# Patient Record
Sex: Male | Born: 1985 | Race: White | Hispanic: No | Marital: Married
Health system: Southern US, Community
[De-identification: ages and names within clinical notes are randomized; demographics above are authoritative.]

## PROBLEM LIST (undated history)

## (undated) DIAGNOSIS — J9621 Acute and chronic respiratory failure with hypoxia: Secondary | ICD-10-CM

## (undated) DIAGNOSIS — Z930 Tracheostomy status: Secondary | ICD-10-CM

## (undated) DIAGNOSIS — S062X9S Diffuse traumatic brain injury with loss of consciousness of unspecified duration, sequela: Secondary | ICD-10-CM

## (undated) DIAGNOSIS — J189 Pneumonia, unspecified organism: Secondary | ICD-10-CM

---

## 2019-10-14 ENCOUNTER — Inpatient Hospital Stay
Admission: RE | Admit: 2019-10-14 | Discharge: 2019-10-31 | Disposition: A | Discharge status: Rehabilitation Facility | Payer: BC Managed Care – PPO | Source: Other Acute Inpatient Hospital | Attending: Internal Medicine | Admitting: Internal Medicine

## 2019-10-14 ENCOUNTER — Other Ambulatory Visit (HOSPITAL_COMMUNITY): Payer: Self-pay

## 2019-10-14 DIAGNOSIS — Z931 Gastrostomy status: Secondary | ICD-10-CM

## 2019-10-14 DIAGNOSIS — J9690 Respiratory failure, unspecified, unspecified whether with hypoxia or hypercapnia: Secondary | ICD-10-CM

## 2019-10-14 DIAGNOSIS — J189 Pneumonia, unspecified organism: Secondary | ICD-10-CM | POA: Diagnosis present

## 2019-10-14 DIAGNOSIS — Z4659 Encounter for fitting and adjustment of other gastrointestinal appliance and device: Secondary | ICD-10-CM

## 2019-10-14 DIAGNOSIS — S062X9S Diffuse traumatic brain injury with loss of consciousness of unspecified duration, sequela: Secondary | ICD-10-CM

## 2019-10-14 DIAGNOSIS — J9621 Acute and chronic respiratory failure with hypoxia: Secondary | ICD-10-CM | POA: Diagnosis present

## 2019-10-14 DIAGNOSIS — Z930 Tracheostomy status: Secondary | ICD-10-CM

## 2019-10-14 HISTORY — DX: Tracheostomy status: Z93.0

## 2019-10-14 HISTORY — DX: Pneumonia, unspecified organism: J18.9

## 2019-10-14 HISTORY — DX: Diffuse traumatic brain injury with loss of consciousness of unspecified duration, sequela: S06.2X9S

## 2019-10-14 HISTORY — DX: Acute and chronic respiratory failure with hypoxia: J96.21

## 2019-10-15 ENCOUNTER — Encounter: Payer: Self-pay | Admitting: Internal Medicine

## 2019-10-15 DIAGNOSIS — Z930 Tracheostomy status: Secondary | ICD-10-CM

## 2019-10-15 DIAGNOSIS — J189 Pneumonia, unspecified organism: Secondary | ICD-10-CM | POA: Diagnosis present

## 2019-10-15 DIAGNOSIS — J9621 Acute and chronic respiratory failure with hypoxia: Secondary | ICD-10-CM | POA: Diagnosis not present

## 2019-10-15 DIAGNOSIS — S062X9S Diffuse traumatic brain injury with loss of consciousness of unspecified duration, sequela: Secondary | ICD-10-CM

## 2019-10-15 LAB — CBC WITH DIFFERENTIAL/PLATELET
Abs Immature Granulocytes: 0.02 10*3/uL (ref 0.00–0.07)
Basophils Absolute: 0 10*3/uL (ref 0.0–0.1)
Basophils Relative: 1 %
Eosinophils Absolute: 0.2 10*3/uL (ref 0.0–0.5)
Eosinophils Relative: 3 %
HCT: 33.6 % — ABNORMAL LOW (ref 39.0–52.0)
Hemoglobin: 10.5 g/dL — ABNORMAL LOW (ref 13.0–17.0)
Immature Granulocytes: 0 %
Lymphocytes Relative: 27 %
Lymphs Abs: 1.9 10*3/uL (ref 0.7–4.0)
MCH: 29.5 pg (ref 26.0–34.0)
MCHC: 31.3 g/dL (ref 30.0–36.0)
MCV: 94.4 fL (ref 80.0–100.0)
Monocytes Absolute: 0.6 10*3/uL (ref 0.1–1.0)
Monocytes Relative: 9 %
Neutro Abs: 4.3 10*3/uL (ref 1.7–7.7)
Neutrophils Relative %: 60 %
Platelets: 345 10*3/uL (ref 150–400)
RBC: 3.56 MIL/uL — ABNORMAL LOW (ref 4.22–5.81)
RDW: 13 % (ref 11.5–15.5)
WBC: 7.2 10*3/uL (ref 4.0–10.5)
nRBC: 0 % (ref 0.0–0.2)

## 2019-10-15 LAB — COMPREHENSIVE METABOLIC PANEL
ALT: 71 U/L — ABNORMAL HIGH (ref 0–44)
AST: 28 U/L (ref 15–41)
Albumin: 2.8 g/dL — ABNORMAL LOW (ref 3.5–5.0)
Alkaline Phosphatase: 74 U/L (ref 38–126)
Anion gap: 8 (ref 5–15)
BUN: 24 mg/dL — ABNORMAL HIGH (ref 6–20)
CO2: 27 mmol/L (ref 22–32)
Calcium: 9.2 mg/dL (ref 8.9–10.3)
Chloride: 109 mmol/L (ref 98–111)
Creatinine, Ser: 0.72 mg/dL (ref 0.61–1.24)
GFR calc Af Amer: >60 mL/min (ref >60)
GFR calc non Af Amer: >60 mL/min (ref >60)
Glucose, Bld: 124 mg/dL — ABNORMAL HIGH (ref 70–99)
Potassium: 3.7 mmol/L (ref 3.5–5.1)
Sodium: 144 mmol/L (ref 135–145)
Total Bilirubin: 0.4 mg/dL (ref 0.3–1.2)
Total Protein: 7.2 g/dL (ref 6.5–8.1)

## 2019-10-15 LAB — PHOSPHORUS: Phosphorus: 4.2 mg/dL (ref 2.5–4.6)

## 2019-10-15 LAB — PROTIME-INR
INR: 1.1 (ref 0.8–1.2)
Prothrombin Time: 13.9 seconds (ref 11.4–15.2)

## 2019-10-15 LAB — MAGNESIUM: Magnesium: 2.3 mg/dL (ref 1.7–2.4)
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
## 2019-10-16 DIAGNOSIS — J189 Pneumonia, unspecified organism: Secondary | ICD-10-CM | POA: Diagnosis not present

## 2019-10-16 DIAGNOSIS — Z930 Tracheostomy status: Secondary | ICD-10-CM | POA: Diagnosis not present

## 2019-10-16 DIAGNOSIS — J9621 Acute and chronic respiratory failure with hypoxia: Secondary | ICD-10-CM | POA: Diagnosis not present

## 2019-10-16 DIAGNOSIS — S062X9S Diffuse traumatic brain injury with loss of consciousness of unspecified duration, sequela: Secondary | ICD-10-CM | POA: Diagnosis not present
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
## 2019-10-17 DIAGNOSIS — S062X9S Diffuse traumatic brain injury with loss of consciousness of unspecified duration, sequela: Secondary | ICD-10-CM | POA: Diagnosis not present

## 2019-10-17 DIAGNOSIS — Z930 Tracheostomy status: Secondary | ICD-10-CM | POA: Diagnosis not present

## 2019-10-17 DIAGNOSIS — J189 Pneumonia, unspecified organism: Secondary | ICD-10-CM | POA: Diagnosis not present

## 2019-10-17 DIAGNOSIS — J9621 Acute and chronic respiratory failure with hypoxia: Secondary | ICD-10-CM | POA: Diagnosis not present

## 2019-10-17 LAB — BASIC METABOLIC PANEL
Anion gap: 9 (ref 5–15)
BUN: 18 mg/dL (ref 6–20)
CO2: 25 mmol/L (ref 22–32)
Calcium: 9.5 mg/dL (ref 8.9–10.3)
Chloride: 107 mmol/L (ref 98–111)
Creatinine, Ser: 0.61 mg/dL (ref 0.61–1.24)
GFR calc Af Amer: >60 mL/min (ref >60)
GFR calc non Af Amer: >60 mL/min (ref >60)
Glucose, Bld: 122 mg/dL — ABNORMAL HIGH (ref 70–99)
Potassium: 3.8 mmol/L (ref 3.5–5.1)
Sodium: 141 mmol/L (ref 135–145)

## 2019-10-17 LAB — CBC
HCT: 34.1 % — ABNORMAL LOW (ref 39.0–52.0)
Hemoglobin: 10.7 g/dL — ABNORMAL LOW (ref 13.0–17.0)
MCH: 29 pg (ref 26.0–34.0)
MCHC: 31.4 g/dL (ref 30.0–36.0)
MCV: 92.4 fL (ref 80.0–100.0)
Platelets: 286 10*3/uL (ref 150–400)
RBC: 3.69 MIL/uL — ABNORMAL LOW (ref 4.22–5.81)
RDW: 13.1 % (ref 11.5–15.5)
WBC: 6.3 10*3/uL (ref 4.0–10.5)
nRBC: 0 % (ref 0.0–0.2)

## 2019-10-17 LAB — MAGNESIUM: Magnesium: 2.3 mg/dL (ref 1.7–2.4)

## 2019-10-17 LAB — PHOSPHORUS: Phosphorus: 3.8 mg/dL (ref 2.5–4.6)
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
## 2019-10-18 ENCOUNTER — Other Ambulatory Visit (HOSPITAL_COMMUNITY): Payer: Self-pay

## 2019-10-18 DIAGNOSIS — S062X9S Diffuse traumatic brain injury with loss of consciousness of unspecified duration, sequela: Secondary | ICD-10-CM | POA: Diagnosis not present

## 2019-10-18 DIAGNOSIS — J9621 Acute and chronic respiratory failure with hypoxia: Secondary | ICD-10-CM | POA: Diagnosis not present

## 2019-10-18 DIAGNOSIS — J189 Pneumonia, unspecified organism: Secondary | ICD-10-CM | POA: Diagnosis not present

## 2019-10-18 DIAGNOSIS — Z930 Tracheostomy status: Secondary | ICD-10-CM | POA: Diagnosis not present
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
## 2019-10-19 ENCOUNTER — Other Ambulatory Visit (HOSPITAL_COMMUNITY): Payer: Self-pay

## 2019-10-19 DIAGNOSIS — S062X9S Diffuse traumatic brain injury with loss of consciousness of unspecified duration, sequela: Secondary | ICD-10-CM | POA: Diagnosis not present

## 2019-10-19 DIAGNOSIS — J9621 Acute and chronic respiratory failure with hypoxia: Secondary | ICD-10-CM | POA: Diagnosis not present

## 2019-10-19 DIAGNOSIS — J189 Pneumonia, unspecified organism: Secondary | ICD-10-CM | POA: Diagnosis not present

## 2019-10-19 DIAGNOSIS — Z930 Tracheostomy status: Secondary | ICD-10-CM | POA: Diagnosis not present

## 2019-10-19 HISTORY — PX: IR GASTROSTOMY TUBE MOD SED: IMG625

## 2019-10-19 LAB — CBC
HCT: 34.6 % — ABNORMAL LOW (ref 39.0–52.0)
Hemoglobin: 11.1 g/dL — ABNORMAL LOW (ref 13.0–17.0)
MCH: 29.5 pg (ref 26.0–34.0)
MCHC: 32.1 g/dL (ref 30.0–36.0)
MCV: 92 fL (ref 80.0–100.0)
Platelets: 225 10*3/uL (ref 150–400)
RBC: 3.76 MIL/uL — ABNORMAL LOW (ref 4.22–5.81)
RDW: 13.3 % (ref 11.5–15.5)
WBC: 6 10*3/uL (ref 4.0–10.5)
nRBC: 0 % (ref 0.0–0.2)

## 2019-10-19 LAB — PROTIME-INR
INR: 1.1 (ref 0.8–1.2)
Prothrombin Time: 14 seconds (ref 11.4–15.2)

## 2019-10-19 MED ORDER — GLUCAGON HCL RDNA (DIAGNOSTIC) 1 MG IJ SOLR
INTRAMUSCULAR | Status: AC | PRN
  Administered 2019-10-19: 1 mg via INTRAVENOUS

## 2019-10-19 MED ORDER — FENTANYL CITRATE (PF) 100 MCG/2ML IJ SOLN
INTRAMUSCULAR | Status: AC
  Filled 2019-10-19: qty 2

## 2019-10-19 MED ORDER — LIDOCAINE HCL 1 % IJ SOLN
INTRAMUSCULAR | Status: AC
  Filled 2019-10-19: qty 20

## 2019-10-19 MED ORDER — CEFAZOLIN SODIUM-DEXTROSE 2-4 GM/100ML-% IV SOLN
INTRAVENOUS | Status: AC
  Administered 2019-10-19: 2 g
  Filled 2019-10-19: qty 100

## 2019-10-19 MED ORDER — MIDAZOLAM HCL 2 MG/2ML IJ SOLN
INTRAMUSCULAR | Status: AC
  Filled 2019-10-19: qty 2

## 2019-10-19 MED ORDER — GLUCAGON HCL RDNA (DIAGNOSTIC) 1 MG IJ SOLR
INTRAMUSCULAR | Status: AC
  Filled 2019-10-19: qty 1

## 2019-10-19 MED ORDER — LIDOCAINE HCL 1 % IJ SOLN
INTRAMUSCULAR | Status: AC | PRN
  Administered 2019-10-19: 10 mL

## 2019-10-19 MED ORDER — FENTANYL CITRATE (PF) 100 MCG/2ML IJ SOLN
INTRAMUSCULAR | Status: AC | PRN
  Administered 2019-10-19: 50 ug via INTRAVENOUS

## 2019-10-19 MED ORDER — IOHEXOL 300 MG/ML  SOLN
50.0000 mL | Freq: Once | INTRAMUSCULAR | Status: AC | PRN
  Administered 2019-10-19: 20 mL

## 2019-10-19 MED ORDER — CEFAZOLIN (ANCEF) 1 G IV SOLR: 2.0000 g | INTRAVENOUS | Status: AC

## 2019-10-19 MED ORDER — MIDAZOLAM HCL 2 MG/2ML IJ SOLN
INTRAMUSCULAR | Status: AC | PRN
  Administered 2019-10-19: 1 mg via INTRAVENOUS

## 2019-10-19 NOTE — Procedures (Signed)
Pre procedure Dx: Dysphagia Post Procedure Dx: Same  Successful fluoroscopic guided insertion of gastrostomy tube.   The gastrostomy tube may be used immediately for medications.   Tube feeds may be initiated in 24 hours as per the primary team.    EBL: Trace  Complications: None immediate  Jay Trexton Escamilla, MD Pager #: [phone]     

## 2019-10-19 NOTE — Progress Notes (Signed)
Pulmonary Critical Care Medicine [facility] [place]   PULMONARY CRITICAL CARE SERVICE  PROGRESS NOTE  Date of Service: 10/19/2019  Willie Grimes [facility]  PYK:[id number]  DOB: [birth year]   DOA: 10/14/2019  Referring Physician: Carron Curie, MD  HPI: Willie Grimes is a 34 y.o. male seen for follow up of Acute on Chronic Respiratory Failure.  He is currently on T collar has been on 20% FiO2 with good saturations.  Medications: Reviewed on Rounds  Physical Exam:  Vitals: Temperature is 97.3 pulse 78 respiratory 19 blood pressure is 135/77 saturations 93%  Ventilator Settings on T collar FiO2 28%  . General: Comfortable at this time . Eyes: Grossly normal lids, irises & conjunctiva . ENT: grossly tongue is normal . Neck: no obvious mass . Cardiovascular: S1 S2 normal no gallop . Respiratory: No rhonchi coarse breath sounds are noted . Abdomen: soft . Skin: no rash seen on limited exam . Musculoskeletal: not rigid . Psychiatric:unable to assess . Neurologic: no seizure no involuntary movements         Lab Data:   Basic Metabolic Panel: Recent Labs  Lab 10/15/19 0436 10/17/19 0625  NA 144 141  K 3.7 3.8  CL 109 107  CO2 27 25  GLUCOSE 124* 122*  BUN 24* 18  CREATININE 0.72 0.61  CALCIUM 9.2 9.5  MG 2.3 2.3  PHOS 4.2 3.8    ABG: No results for input(s): PHART, PCO2ART, PO2ART, HCO3, O2SAT in the last 168 hours.  Liver Function Tests: Recent Labs  Lab 10/15/19 0436  AST 28  ALT 71*  ALKPHOS 74  BILITOT 0.4  PROT 7.2  ALBUMIN 2.8*   No results for input(s): LIPASE, AMYLASE in the last 168 hours. No results for input(s): AMMONIA in the last 168 hours.  CBC: Recent Labs  Lab 10/15/19 0436 10/17/19 0625 10/19/19 0807  WBC 7.2 6.3 6.0  NEUTROABS 4.3  --   --   HGB 10.5* 10.7* 11.1*  HCT 33.6* 34.1* 34.6*  MCV 94.4 92.4 92.0  PLT 345 286 225    Cardiac Enzymes: No results for input(s): CKTOTAL, CKMB, CKMBINDEX, TROPONINI in the last 168  hours.  BNP (last 3 results) No results for input(s): BNP in the last 8760 hours.  ProBNP (last 3 results) No results for input(s): PROBNP in the last 8760 hours.  Radiological Exams: CT ABDOMEN WO CONTRAST  Result Date: 10/18/2019 CLINICAL DATA:  Acute on chronic respiratory failure, traumatic brain injury, pneumonia EXAM: CT ABDOMEN WITHOUT CONTRAST TECHNIQUE: Multidetector CT imaging of the abdomen was performed following the standard protocol without IV contrast. COMPARISON:  None. FINDINGS: Lower chest: Lingula and left lower lobe bandlike atelectasis. Right basilar atelectasis/areas of partial collapse/consolidation. No pleural effusion. Normal heart size. No significant hiatal hernia. Hepatobiliary: Limited without IV contrast. No large focal hepatic abnormality or intrahepatic biliary dilatation. Gallbladder and biliary system unremarkable. Common bile duct nondilated. Pancreas: Unremarkable. No pancreatic ductal dilatation or surrounding inflammatory changes. Spleen: Normal in size without focal abnormality. Adrenals/Urinary Tract: Normal adrenal glands. No renal obstruction or hydronephrosis. No perinephric inflammatory process, obstructive uropathy, or hydroureter. Tiny punctate nonobstructing intrarenal calculi measuring 3 mm or less. Stomach/Bowel: Feeding tube within the for portion of the duodenum. Negative for bowel obstruction, significant dilatation, ileus, or free air. Normal appendix visualized. Stomach position is inferior to the left hepatic lobe and superior to the transverse colon. Stomach position is slightly high beneath the subcostal margin however with insufflation the anatomy is likely favorable for percutaneous  gastrostomy technique. No free fluid, fluid collection, hemorrhage, hematoma, or abscess. Vascular/Lymphatic: Negative for aneurysm. Limited without IV contrast. No bulky adenopathy. Other: Intact abdominal wall.  Negative for hernia. Musculoskeletal: Degenerative  changes of the spine with osteophytes and bony spurring. No acute osseous finding. IMPRESSION: High positioned stomach however with stomach insufflation the anatomy is likely favorable for percutaneous gastrostomy technique. No other acute intra-abdominal finding by noncontrast CT Bibasilar atelectasis and partial right lower lobe collapse/consolidation. Electronically Signed   By: Judie Petit.  Shick M.D.   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 Acute on chronic respiratory failure hypoxia patient currently is on T collar has been on 28% FiO2 we will continue to try to advance weaning. 2. Tracheostomy changed over downsized 3. Diffuse traumatic brain injury showing some increased consciousness however still not really improved 4. Healthcare associated pneumonia treated clinically is improved   I have personally seen and evaluated the patient, evaluated laboratory and imaging results, formulated the assessment and plan and placed orders. The Patient requires high complexity decision making with multiple systems involvement.  Rounds were done with the Respiratory Therapy Director and Staff therapists and discussed with nursing staff also.  Yevonne Pax, MD [facility] Pulmonary Critical Care Medicine Sleep Medicine

## 2019-10-20 DIAGNOSIS — Z930 Tracheostomy status: Secondary | ICD-10-CM | POA: Diagnosis not present

## 2019-10-20 DIAGNOSIS — S062X9S Diffuse traumatic brain injury with loss of consciousness of unspecified duration, sequela: Secondary | ICD-10-CM | POA: Diagnosis not present

## 2019-10-20 DIAGNOSIS — J189 Pneumonia, unspecified organism: Secondary | ICD-10-CM | POA: Diagnosis not present

## 2019-10-20 DIAGNOSIS — J9621 Acute and chronic respiratory failure with hypoxia: Secondary | ICD-10-CM | POA: Diagnosis not present

## 2019-10-20 NOTE — Progress Notes (Addendum)
Pulmonary Critical Care Medicine [facility] [place]   PULMONARY CRITICAL CARE SERVICE  PROGRESS NOTE  Date of Service: 10/20/2019  Willie Grimes [facility]  EHM:[id number]  DOB: [birth year]   DOA: 10/14/2019  Referring Physician: Carron Curie, MD  HPI: Willie Grimes is a 34 y.o. male seen for follow up of Acute on Chronic Respiratory Failure.  Patient remains on 21% aerosol trach collar using PMV with no difficulty satting well no distress.  Medications: Reviewed on Rounds  Physical Exam:  Vitals: Pulse 60 respirations 18 BP 124/71 O2 sat 97% temp 97.5  Ventilator Settings ATC 21%  . General: Comfortable at this time . Eyes: Grossly normal lids, irises & conjunctiva . ENT: grossly tongue is normal . Neck: no obvious mass . Cardiovascular: S1 S2 normal no gallop . Respiratory: No rales or rhonchi noted . Abdomen: soft . Skin: no rash seen on limited exam . Musculoskeletal: not rigid . Psychiatric:unable to assess . Neurologic: no seizure no involuntary movements         Lab Data:   Basic Metabolic Panel: Recent Labs  Lab 10/15/19 0436 10/17/19 0625  NA 144 141  K 3.7 3.8  CL 109 107  CO2 27 25  GLUCOSE 124* 122*  BUN 24* 18  CREATININE 0.72 0.61  CALCIUM 9.2 9.5  MG 2.3 2.3  PHOS 4.2 3.8    ABG: No results for input(s): PHART, PCO2ART, PO2ART, HCO3, O2SAT in the last 168 hours.  Liver Function Tests: Recent Labs  Lab 10/15/19 0436  AST 28  ALT 71*  ALKPHOS 74  BILITOT 0.4  PROT 7.2  ALBUMIN 2.8*   No results for input(s): LIPASE, AMYLASE in the last 168 hours. No results for input(s): AMMONIA in the last 168 hours.  CBC: Recent Labs  Lab 10/15/19 0436 10/17/19 0625 10/19/19 0807  WBC 7.2 6.3 6.0  NEUTROABS 4.3  --   --   HGB 10.5* 10.7* 11.1*  HCT 33.6* 34.1* 34.6*  MCV 94.4 92.4 92.0  PLT 345 286 225    Cardiac Enzymes: No results for input(s): CKTOTAL, CKMB, CKMBINDEX, TROPONINI in the last 168 hours.  BNP (last 3 results) No  results for input(s): BNP in the last 8760 hours.  ProBNP (last 3 results) No results for input(s): PROBNP in the last 8760 hours.  Radiological Exams: IR GASTROSTOMY TUBE MOD SED  Result Date: 10/19/2019 INDICATION: History traumatic brain injury now with dysphagia. Request made for placement a gastrostomy tube for enteric nutrition supplementation purposes. EXAM: PULL TROUGH GASTROSTOMY TUBE PLACEMENT COMPARISON:  None. MEDICATIONS: Ancef 2 gm IV; Antibiotics were administered within 1 hour of the procedure. Glucagon 1 mg IV CONTRAST:  20 mL of Omnipaque 300 administered into the gastric lumen. ANESTHESIA/SEDATION: Moderate (conscious) sedation was employed during this procedure. A total of Versed 1 mg and Fentanyl 50 mcg was administered intravenously. Moderate Sedation Time: 14 minutes. The patient's level of consciousness and vital signs were monitored continuously by radiology nursing throughout the procedure under my direct supervision. FLUOROSCOPY TIME:  4 minutes, 30 seconds (172 mGy) COMPLICATIONS: None immediate. PROCEDURE: Informed written consent was obtained from patient's family following explanation of the procedure, risks, benefits and alternatives. A time out was performed prior to the initiation of the procedure. Ultrasound scanning was performed to demarcate the edge of the left lobe of the liver. Maximal barrier sterile technique utilized including caps, mask, sterile gowns, sterile gloves, large sterile drape, hand hygiene and Betadine prep. The left upper quadrant was sterilely prepped  and draped. An oral gastric catheter was inserted into the stomach under fluoroscopy. The existing nasogastric feeding tube was removed. The left costal margin and air opacified transverse colon were identified and avoided. Air was injected into the stomach for insufflation and visualization under fluoroscopy. Under sterile conditions a 17 gauge trocar needle was utilized to access the stomach  percutaneously beneath the left subcostal margin after the overlying soft tissues were anesthetized with 1% Lidocaine with epinephrine. Needle position was confirmed within the stomach with aspiration of air and injection of small amount of contrast. A single T tack was deployed for gastropexy. Over an Amplatz guide wire, a 9-French sheath was inserted into the stomach. A snare device was utilized to capture the oral gastric catheter. The snare device was pulled retrograde from the stomach up the esophagus and out the oropharynx. The 20-French pull-through gastrostomy was connected to the snare device and pulled antegrade through the oropharynx down the esophagus into the stomach and then through the percutaneous tract external to the patient. The gastrostomy was assembled externally. Contrast injection confirms position in the stomach. Several spot radiographic images were obtained in various obliquities for documentation. The patient tolerated procedure well without immediate post procedural complication. FINDINGS: After successful fluoroscopic guided placement, the gastrostomy tube is appropriately positioned with internal disc against the ventral aspect of the gastric lumen. IMPRESSION: Successful fluoroscopic insertion of a 20-French pull-through gastrostomy tube. The gastrostomy may be used immediately for medication administration and in 24 hrs for the initiation of feeds. Electronically Signed   By: Simonne Come M.D.   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 Acute on chronic respiratory failure hypoxia patient remains on 28% aerosol trach collar will continue to wean FiO2 as tolerated. 2. Tracheostomy changed over downsized 3. Diffuse traumatic brain injury  showing some increased consciousness however still not really improved 4. Healthcare associated pneumonia treated clinically is improved   I have personally seen and evaluated the patient, evaluated laboratory and imaging results, formulated the assessment and plan and placed orders. The Patient requires high complexity decision making with multiple systems involvement.  Rounds were done with the Respiratory Therapy Director and Staff therapists and discussed with nursing staff also.  Yevonne Pax, MD [facility] Pulmonary Critical Care Medicine Sleep Medicine

## 2019-10-20 NOTE — Progress Notes (Signed)
Referring Physician(s): Boone Master  Supervising Physician: Oley Balm  Patient Status: [facility]- inpt  Chief Complaint: None- tracheostomy without sedation  Subjective:  Dysphasia s/p percutaneous gastrostomy tube placement in IR 10/19/2019 by Dr. Grace Isaac. Patient laying in bed resting comfortably, tracheostomy in place. He opens eyes to voice and quickly falls back asleep. Family at bedside.   Allergies: Patient has no allergy information on record.  Medications: Prior to Admission medications   Not on File     Vital Signs: BP (!) 152/96 (BP Location: Right Arm)   Pulse 80   Resp 18   SpO2 100%   Physical Exam Vitals and nursing note reviewed.  Constitutional:      General: He is not in acute distress.    Comments: Tracheostomy.   Pulmonary:     Effort: Pulmonary effort is normal. No respiratory distress.     Comments: Tracheostomy.  Abdominal:     Comments: Gastrostomy site with minimal amount of dried blood at insertion site, no active bleeding, erythema, or drainage noted. Bumper cinched to skin.  Skin:    General: Skin is warm and dry.     Imaging: CT ABDOMEN WO CONTRAST  Result Date: 10/18/2019 CLINICAL DATA:  Acute on chronic respiratory failure, traumatic brain injury, pneumonia EXAM: CT ABDOMEN WITHOUT CONTRAST TECHNIQUE: Multidetector CT imaging of the abdomen was performed following the standard protocol without IV contrast. COMPARISON:  None. FINDINGS: Lower chest: Lingula and left lower lobe bandlike atelectasis. Right basilar atelectasis/areas of partial collapse/consolidation. No pleural effusion. Normal heart size. No significant hiatal hernia. Hepatobiliary: Limited without IV contrast. No large focal hepatic abnormality or intrahepatic biliary dilatation. Gallbladder and biliary system unremarkable. Common bile duct nondilated. Pancreas: Unremarkable. No pancreatic ductal dilatation or surrounding inflammatory changes. Spleen: Normal in size without  focal abnormality. Adrenals/Urinary Tract: Normal adrenal glands. No renal obstruction or hydronephrosis. No perinephric inflammatory process, obstructive uropathy, or hydroureter. Tiny punctate nonobstructing intrarenal calculi measuring 3 mm or less. Stomach/Bowel: Feeding tube within the for portion of the duodenum. Negative for bowel obstruction, significant dilatation, ileus, or free air. Normal appendix visualized. Stomach position is inferior to the left hepatic lobe and superior to the transverse colon. Stomach position is slightly high beneath the subcostal margin however with insufflation the anatomy is likely favorable for percutaneous gastrostomy technique. No free fluid, fluid collection, hemorrhage, hematoma, or abscess. Vascular/Lymphatic: Negative for aneurysm. Limited without IV contrast. No bulky adenopathy. Other: Intact abdominal wall.  Negative for hernia. Musculoskeletal: Degenerative changes of the spine with osteophytes and bony spurring. No acute osseous finding. IMPRESSION: High positioned stomach however with stomach insufflation the anatomy is likely favorable for percutaneous gastrostomy technique. No other acute intra-abdominal finding by noncontrast CT Bibasilar atelectasis and partial right lower lobe collapse/consolidation. Electronically Signed   By: Judie Petit.  Shick M.D.   On: 10/18/2019 07:55   IR GASTROSTOMY TUBE MOD SED  Result Date: 10/19/2019 INDICATION: History traumatic brain injury now with dysphagia. Request made for placement a gastrostomy tube for enteric nutrition supplementation purposes. EXAM: PULL TROUGH GASTROSTOMY TUBE PLACEMENT COMPARISON:  None. MEDICATIONS: Ancef 2 gm IV; Antibiotics were administered within 1 hour of the procedure. Glucagon 1 mg IV CONTRAST:  20 mL of Omnipaque 300 administered into the gastric lumen. ANESTHESIA/SEDATION: Moderate (conscious) sedation was employed during this procedure. A total of Versed 1 mg and Fentanyl 50 mcg was administered  intravenously. Moderate Sedation Time: 14 minutes. The patient's level of consciousness and vital signs were monitored continuously by radiology  nursing throughout the procedure under my direct supervision. FLUOROSCOPY TIME:  4 minutes, 30 seconds (172 mGy) COMPLICATIONS: None immediate. PROCEDURE: Informed written consent was obtained from patient's family following explanation of the procedure, risks, benefits and alternatives. A time out was performed prior to the initiation of the procedure. Ultrasound scanning was performed to demarcate the edge of the left lobe of the liver. Maximal barrier sterile technique utilized including caps, mask, sterile gowns, sterile gloves, large sterile drape, hand hygiene and Betadine prep. The left upper quadrant was sterilely prepped and draped. An oral gastric catheter was inserted into the stomach under fluoroscopy. The existing nasogastric feeding tube was removed. The left costal margin and air opacified transverse colon were identified and avoided. Air was injected into the stomach for insufflation and visualization under fluoroscopy. Under sterile conditions a 17 gauge trocar needle was utilized to access the stomach percutaneously beneath the left subcostal margin after the overlying soft tissues were anesthetized with 1% Lidocaine with epinephrine. Needle position was confirmed within the stomach with aspiration of air and injection of small amount of contrast. A single T tack was deployed for gastropexy. Over an Amplatz guide wire, a 9-French sheath was inserted into the stomach. A snare device was utilized to capture the oral gastric catheter. The snare device was pulled retrograde from the stomach up the esophagus and out the oropharynx. The 20-French pull-through gastrostomy was connected to the snare device and pulled antegrade through the oropharynx down the esophagus into the stomach and then through the percutaneous tract external to the patient. The gastrostomy  was assembled externally. Contrast injection confirms position in the stomach. Several spot radiographic images were obtained in various obliquities for documentation. The patient tolerated procedure well without immediate post procedural complication. FINDINGS: After successful fluoroscopic guided placement, the gastrostomy tube is appropriately positioned with internal disc against the ventral aspect of the gastric lumen. IMPRESSION: Successful fluoroscopic insertion of a 20-French pull-through gastrostomy tube. The gastrostomy may be used immediately for medication administration and in 24 hrs for the initiation of feeds. Electronically Signed   By: Simonne Come M.D.   On: 10/19/2019 15:04    Labs:  CBC: Recent Labs    10/15/19 0436 10/17/19 0625 10/19/19 0807  WBC 7.2 6.3 6.0  HGB 10.5* 10.7* 11.1*  HCT 33.6* 34.1* 34.6*  PLT 345 286 225    COAGS: Recent Labs    10/15/19 0436 10/19/19 0807  INR 1.1 1.1    BMP: Recent Labs    10/15/19 0436 10/17/19 0625  NA 144 141  K 3.7 3.8  CL 109 107  CO2 27 25  GLUCOSE 124* 122*  BUN 24* 18  CALCIUM 9.2 9.5  CREATININE 0.72 0.61  GFRNONAA >60 >60  GFRAA >60 >60    LIVER FUNCTION TESTS: Recent Labs    10/15/19 0436  BILITOT 0.4  AST 28  ALT 71*  ALKPHOS 74  PROT 7.2  ALBUMIN 2.8*    Assessment and Plan:  Dysphasia s/p percutaneous gastrostomy tube placement in IR 10/19/2019 by Dr. Grace Isaac. Gastrostomy tube stable- tube is ready for use. Please ensure bumper cinched to skin to prevent leakage. Please call IR with questions/concerns.   Electronically Signed: Elwin Mocha, PA-C 10/20/2019, 1:59 PM   I spent a total of 15 Minutes at the the patient's bedside AND on the patient's hospital floor or unit, greater than 50% of which was counseling/coordinating care for dysphasia s/p gastrostomy tube placement.

## 2019-10-21 DIAGNOSIS — J189 Pneumonia, unspecified organism: Secondary | ICD-10-CM | POA: Diagnosis not present

## 2019-10-21 DIAGNOSIS — S062X9S Diffuse traumatic brain injury with loss of consciousness of unspecified duration, sequela: Secondary | ICD-10-CM | POA: Diagnosis not present

## 2019-10-21 DIAGNOSIS — J9621 Acute and chronic respiratory failure with hypoxia: Secondary | ICD-10-CM | POA: Diagnosis not present

## 2019-10-21 DIAGNOSIS — Z930 Tracheostomy status: Secondary | ICD-10-CM | POA: Diagnosis not present

## 2019-10-21 NOTE — Progress Notes (Signed)
Pulmonary Critical Care Medicine [facility] [place]   PULMONARY CRITICAL CARE SERVICE  PROGRESS NOTE  Date of Service: 10/21/2019  Willie Grimes [facility]  IDP:[id number]  DOB: [birth year]   DOA: 10/14/2019  Referring Physician: Carron Curie, MD  HPI: Willie Grimes is a 34 y.o. male seen for follow up of Acute on Chronic Respiratory Failure.  He is more awake more alert.  Appears to interact with the examiner.  Has been using the PMV without any distress  Medications: Reviewed on Rounds  Physical Exam:  Vitals: Temperature 98.1 pulse 65 respiratory 16 blood pressure is 125/64 saturations 94%  Ventilator Settings on T collar FiO2 28%  . General: Comfortable at this time . Eyes: Grossly normal lids, irises & conjunctiva . ENT: grossly tongue is normal . Neck: no obvious mass . Cardiovascular: S1 S2 normal no gallop . Respiratory: No rhonchi no rales are noted at this time . Abdomen: soft . Skin: no rash seen on limited exam . Musculoskeletal: not rigid . Psychiatric:unable to assess . Neurologic: no seizure no involuntary movements         Lab Data:   Basic Metabolic Panel: Recent Labs  Lab 10/15/19 0436 10/17/19 0625  NA 144 141  K 3.7 3.8  CL 109 107  CO2 27 25  GLUCOSE 124* 122*  BUN 24* 18  CREATININE 0.72 0.61  CALCIUM 9.2 9.5  MG 2.3 2.3  PHOS 4.2 3.8    ABG: No results for input(s): PHART, PCO2ART, PO2ART, HCO3, O2SAT in the last 168 hours.  Liver Function Tests: Recent Labs  Lab 10/15/19 0436  AST 28  ALT 71*  ALKPHOS 74  BILITOT 0.4  PROT 7.2  ALBUMIN 2.8*   No results for input(s): LIPASE, AMYLASE in the last 168 hours. No results for input(s): AMMONIA in the last 168 hours.  CBC: Recent Labs  Lab 10/15/19 0436 10/17/19 0625 10/19/19 0807  WBC 7.2 6.3 6.0  NEUTROABS 4.3  --   --   HGB 10.5* 10.7* 11.1*  HCT 33.6* 34.1* 34.6*  MCV 94.4 92.4 92.0  PLT 345 286 225    Cardiac Enzymes: No results for input(s): CKTOTAL, CKMB,  CKMBINDEX, TROPONINI in the last 168 hours.  BNP (last 3 results) No results for input(s): BNP in the last 8760 hours.  ProBNP (last 3 results) No results for input(s): PROBNP in the last 8760 hours.  Radiological Exams: No results found.  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 Acute on chronic respiratory failure with hypoxia continues to show improvement working towards capping and decannulation 2. Tracheostomy working towards static capping and decannulation 3. Traumatic brain injury supportive care showing signs of improvement 4. Healthcare associated pneumonia treated improved   I have personally seen and evaluated the patient, evaluated laboratory and imaging results, formulated the assessment and plan and placed orders. The Patient requires high complexity decision making with multiple systems involvement.  Rounds were done with the Respiratory Therapy Director and Staff therapists and discussed with nursing staff also.  Yevonne Pax, MD [facility] Pulmonary Critical Care Medicine Sleep Medicine

## 2019-10-22 LAB — PHOSPHORUS: Phosphorus: 4.2 mg/dL (ref 2.5–4.6)

## 2019-10-22 LAB — CBC
HCT: 37.3 % — ABNORMAL LOW (ref 39.0–52.0)
Hemoglobin: 11.6 g/dL — ABNORMAL LOW (ref 13.0–17.0)
MCH: 28.6 pg (ref 26.0–34.0)
MCHC: 31.1 g/dL (ref 30.0–36.0)
MCV: 91.9 fL (ref 80.0–100.0)
Platelets: 157 10*3/uL (ref 150–400)
RBC: 4.06 MIL/uL — ABNORMAL LOW (ref 4.22–5.81)
RDW: 13.2 % (ref 11.5–15.5)
WBC: 3.8 10*3/uL — ABNORMAL LOW (ref 4.0–10.5)
nRBC: 0 % (ref 0.0–0.2)

## 2019-10-22 LAB — BASIC METABOLIC PANEL
Anion gap: 10 (ref 5–15)
BUN: 15 mg/dL (ref 6–20)
CO2: 25 mmol/L (ref 22–32)
Calcium: 9.4 mg/dL (ref 8.9–10.3)
Chloride: 109 mmol/L (ref 98–111)
Creatinine, Ser: 0.75 mg/dL (ref 0.61–1.24)
GFR calc Af Amer: >60 mL/min (ref >60)
GFR calc non Af Amer: >60 mL/min (ref >60)
Glucose, Bld: 108 mg/dL — ABNORMAL HIGH (ref 70–99)
Potassium: 3.8 mmol/L (ref 3.5–5.1)
Sodium: 144 mmol/L (ref 135–145)

## 2019-10-22 LAB — MAGNESIUM: Magnesium: 2.2 mg/dL (ref 1.7–2.4)

## 2019-10-23 DIAGNOSIS — Z930 Tracheostomy status: Secondary | ICD-10-CM | POA: Diagnosis not present

## 2019-10-23 DIAGNOSIS — J189 Pneumonia, unspecified organism: Secondary | ICD-10-CM | POA: Diagnosis not present

## 2019-10-23 DIAGNOSIS — S062X9S Diffuse traumatic brain injury with loss of consciousness of unspecified duration, sequela: Secondary | ICD-10-CM | POA: Diagnosis not present

## 2019-10-23 DIAGNOSIS — J9621 Acute and chronic respiratory failure with hypoxia: Secondary | ICD-10-CM | POA: Diagnosis not present

## 2019-10-23 LAB — CBC
HCT: 36.1 % — ABNORMAL LOW (ref 39.0–52.0)
Hemoglobin: 11.2 g/dL — ABNORMAL LOW (ref 13.0–17.0)
MCH: 28.8 pg (ref 26.0–34.0)
MCHC: 31 g/dL (ref 30.0–36.0)
MCV: 92.8 fL (ref 80.0–100.0)
Platelets: 146 10*3/uL — ABNORMAL LOW (ref 150–400)
RBC: 3.89 MIL/uL — ABNORMAL LOW (ref 4.22–5.81)
RDW: 13.3 % (ref 11.5–15.5)
WBC: 4.1 10*3/uL (ref 4.0–10.5)
nRBC: 0 % (ref 0.0–0.2)

## 2019-10-23 NOTE — Progress Notes (Signed)
Pulmonary Critical Care Medicine [facility] [place]   PULMONARY CRITICAL CARE SERVICE  PROGRESS NOTE  Date of Service: 10/23/2019  Willie Grimes  OVF:[id number]  DOB: 12-06-1985   DOA: 10/14/2019  Referring Physician: Carron Curie, MD  HPI: Willie Grimes is a 34 y.o. male seen for follow up of Acute on Chronic Respiratory Failure.  Patient is doing remarkably well he is capping today will be 48 hours on capping trials  Medications: Reviewed on Rounds  Physical Exam:  Vitals: Temperature 97.4 pulse 65 respiratory rate 23 blood pressure is 138/78 saturations 98%  Ventilator Settings capping on room air  . General: Comfortable at this time . Eyes: Grossly normal lids, irises & conjunctiva . ENT: grossly tongue is normal . Neck: no obvious mass . Cardiovascular: S1 S2 normal no gallop . Respiratory: No rhonchi no rales are noted at this time . Abdomen: soft . Skin: no rash seen on limited exam . Musculoskeletal: not rigid . Psychiatric:unable to assess . Neurologic: no seizure no involuntary movements         Lab Data:   Basic Metabolic Panel: Recent Labs  Lab 10/17/19 0625 10/22/19 0720  NA 141 144  K 3.8 3.8  CL 107 109  CO2 25 25  GLUCOSE 122* 108*  BUN 18 15  CREATININE 0.61 0.75  CALCIUM 9.5 9.4  MG 2.3 2.2  PHOS 3.8 4.2    ABG: No results for input(s): PHART, PCO2ART, PO2ART, HCO3, O2SAT in the last 168 hours.  Liver Function Tests: No results for input(s): AST, ALT, ALKPHOS, BILITOT, PROT, ALBUMIN in the last 168 hours. No results for input(s): LIPASE, AMYLASE in the last 168 hours. No results for input(s): AMMONIA in the last 168 hours.  CBC: Recent Labs  Lab 10/17/19 0625 10/19/19 0807 10/22/19 0720 10/23/19 0557  WBC 6.3 6.0 3.8* 4.1  HGB 10.7* 11.1* 11.6* 11.2*  HCT 34.1* 34.6* 37.3* 36.1*  MCV 92.4 92.0 91.9 92.8  PLT 286 225 157 146*    Cardiac Enzymes: No results for input(s): CKTOTAL, CKMB, CKMBINDEX, TROPONINI in  the last 168 hours.  BNP (last 3 results) No results for input(s): BNP in the last 8760 hours.  ProBNP (last 3 results) No results for input(s): PROBNP in the last 8760 hours.  Radiological Exams: No results found.  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 Acute on chronic respiratory failure hypoxia plan is to continue with capping trials titrate as tolerated. 2. Tracheostomy working towards decannulation 3. Diffuse traumatic brain injury no change overall but he is much more responsive and interactive. 4. Healthcare associated pneumonia treated we will continue to follow along.   I have personally seen and evaluated the patient, evaluated laboratory and imaging results, formulated the assessment and plan and placed orders. The Patient requires high complexity decision making with multiple systems involvement.  Rounds were done with the Respiratory Therapy Director and Staff therapists and discussed with nursing staff also.  Yevonne Pax, MD [facility] Pulmonary Critical Care Medicine Sleep Medicine

## 2019-10-24 DIAGNOSIS — J189 Pneumonia, unspecified organism: Secondary | ICD-10-CM | POA: Diagnosis not present

## 2019-10-24 DIAGNOSIS — S062X9S Diffuse traumatic brain injury with loss of consciousness of unspecified duration, sequela: Secondary | ICD-10-CM | POA: Diagnosis not present

## 2019-10-24 DIAGNOSIS — Z930 Tracheostomy status: Secondary | ICD-10-CM | POA: Diagnosis not present

## 2019-10-24 DIAGNOSIS — J9621 Acute and chronic respiratory failure with hypoxia: Secondary | ICD-10-CM | POA: Diagnosis not present

## 2019-10-24 NOTE — Progress Notes (Signed)
Pulmonary Critical Care Medicine [facility] [place]   PULMONARY CRITICAL CARE SERVICE  PROGRESS NOTE  Date of Service: 10/24/2019  Willie Grimes [facility]  BWL:[id number]  DOB: [birth year]   DOA: 10/14/2019  Referring Physician: Carron Curie, MD  HPI: Willie Grimes is a 34 y.o. male seen for follow up of Acute on Chronic Respiratory Failure.  Patient is capping currently is on room air today will be more than 72 hours  Medications: Reviewed on Rounds  Physical Exam:  Vitals: Temperature is 97.3 pulse 65 respiratory 15 blood pressure is 119/62 saturations 95%  Ventilator Settings on capping trials room air  . General: Comfortable at this time . Eyes: Grossly normal lids, irises & conjunctiva . ENT: grossly tongue is normal . Neck: no obvious mass . Cardiovascular: S1 S2 normal no gallop . Respiratory: No rhonchi no rales noted at this time . Abdomen: soft . Skin: no rash seen on limited exam . Musculoskeletal: not rigid . Psychiatric:unable to assess . Neurologic: no seizure no involuntary movements         Lab Data:   Basic Metabolic Panel: Recent Labs  Lab 10/22/19 0720  NA 144  K 3.8  CL 109  CO2 25  GLUCOSE 108*  BUN 15  CREATININE 0.75  CALCIUM 9.4  MG 2.2  PHOS 4.2    ABG: No results for input(s): PHART, PCO2ART, PO2ART, HCO3, O2SAT in the last 168 hours.  Liver Function Tests: No results for input(s): AST, ALT, ALKPHOS, BILITOT, PROT, ALBUMIN in the last 168 hours. No results for input(s): LIPASE, AMYLASE in the last 168 hours. No results for input(s): AMMONIA in the last 168 hours.  CBC: Recent Labs  Lab 10/19/19 0807 10/22/19 0720 10/23/19 0557  WBC 6.0 3.8* 4.1  HGB 11.1* 11.6* 11.2*  HCT 34.6* 37.3* 36.1*  MCV 92.0 91.9 92.8  PLT 225 157 146*    Cardiac Enzymes: No results for input(s): CKTOTAL, CKMB, CKMBINDEX, TROPONINI in the last 168 hours.  BNP (last 3 results) No results for input(s): BNP in the last 8760  hours.  ProBNP (last 3 results) No results for input(s): PROBNP in the last 8760 hours.  Radiological Exams: No results found.  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 Acute on chronic respiratory failure hypoxia plan is to continue with advancing the weaning should be ready for decannulation. 2. Tracheostomy will be removed 3. Diffuse traumatic brain injury clinically showing improvement 4. Healthcare associated pneumonia treated in resolution.   I have personally seen and evaluated the patient, evaluated laboratory and imaging results, formulated the assessment and plan and placed orders. The Patient requires high complexity decision making with multiple systems involvement.  Rounds were done with the Respiratory Therapy Director and Staff therapists and discussed with nursing staff also.  Yevonne Pax, MD [facility] Pulmonary Critical Care Medicine Sleep Medicine

## 2019-10-25 DIAGNOSIS — J189 Pneumonia, unspecified organism: Secondary | ICD-10-CM | POA: Diagnosis not present

## 2019-10-25 DIAGNOSIS — S062X9S Diffuse traumatic brain injury with loss of consciousness of unspecified duration, sequela: Secondary | ICD-10-CM | POA: Diagnosis not present

## 2019-10-25 DIAGNOSIS — J9621 Acute and chronic respiratory failure with hypoxia: Secondary | ICD-10-CM | POA: Diagnosis not present

## 2019-10-25 DIAGNOSIS — Z930 Tracheostomy status: Secondary | ICD-10-CM | POA: Diagnosis not present

## 2019-10-25 NOTE — Progress Notes (Addendum)
Pulmonary Critical Care Medicine [facility] [place]   PULMONARY CRITICAL CARE SERVICE  PROGRESS NOTE  Date of Service: 10/25/2019  Lazlo Tunney [facility]  RKY:[id number]  DOB: May 06, 1985   DOA: 10/14/2019  Referring Physician: Carron Curie, MD  HPI: Willie Grimes is a 34 y.o. male seen for follow up of Acute on Chronic Respiratory Failure.  Patient was decannulated yesterday remains on room air at this time, no fever distress.  Medications: Reviewed on Rounds  Physical Exam:  Vitals: Pulse 67 respirations 11 BP 127/78 O2 sat 98% temp 96.9  Ventilator Settings room air  . General: Comfortable at this time . Eyes: Grossly normal lids, irises & conjunctiva . ENT: grossly tongue is normal . Neck: no obvious mass . Cardiovascular: S1 S2 normal no gallop . Respiratory: No rales or rhonchi noted . Abdomen: soft . Skin: no rash seen on limited exam . Musculoskeletal: not rigid . Psychiatric:unable to assess . Neurologic: no seizure no involuntary movements         Lab Data:   Basic Metabolic Panel: Recent Labs  Lab 10/22/19 0720  NA 144  K 3.8  CL 109  CO2 25  GLUCOSE 108*  BUN 15  CREATININE 0.75  CALCIUM 9.4  MG 2.2  PHOS 4.2    ABG: No results for input(s): PHART, PCO2ART, PO2ART, HCO3, O2SAT in the last 168 hours.  Liver Function Tests: No results for input(s): AST, ALT, ALKPHOS, BILITOT, PROT, ALBUMIN in the last 168 hours. No results for input(s): LIPASE, AMYLASE in the last 168 hours. No results for input(s): AMMONIA in the last 168 hours.  CBC: Recent Labs  Lab 10/19/19 0807 10/22/19 0720 10/23/19 0557  WBC 6.0 3.8* 4.1  HGB 11.1* 11.6* 11.2*  HCT 34.6* 37.3* 36.1*  MCV 92.0 91.9 92.8  PLT 225 157 146*    Cardiac Enzymes: No results for input(s): CKTOTAL, CKMB, CKMBINDEX, TROPONINI in the last 168 hours.  BNP (last 3 results) No results for input(s): BNP in the last 8760 hours.  ProBNP (last 3 results) No results for input(s):  PROBNP in the last 8760 hours.  Radiological Exams: No results found.  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 Acute on chronic respiratory failure hypoxia patient was successfully decannulated yesterday remains on room air at this time 2. Tracheostomy removed yesterday stoma remains open. 3. Diffuse traumatic brain injury clinically showing improvement 4. Healthcare associated pneumonia treated in resolution.   I have personally seen and evaluated the patient, evaluated laboratory and imaging results, formulated the assessment and plan and placed orders. The Patient requires high complexity decision making with multiple systems involvement.  Rounds were done with the Respiratory Therapy Director and Staff therapists and discussed with nursing staff also.  Yevonne Pax, MD [facility] Pulmonary Critical Care Medicine Sleep Medicine

## 2019-10-26 DIAGNOSIS — Z930 Tracheostomy status: Secondary | ICD-10-CM | POA: Diagnosis not present

## 2019-10-26 DIAGNOSIS — S062X9S Diffuse traumatic brain injury with loss of consciousness of unspecified duration, sequela: Secondary | ICD-10-CM | POA: Diagnosis not present

## 2019-10-26 DIAGNOSIS — J9621 Acute and chronic respiratory failure with hypoxia: Secondary | ICD-10-CM | POA: Diagnosis not present

## 2019-10-26 DIAGNOSIS — J189 Pneumonia, unspecified organism: Secondary | ICD-10-CM | POA: Diagnosis not present

## 2019-10-26 LAB — DIC (DISSEMINATED INTRAVASCULAR COAGULATION)PANEL
D-Dimer, Quant: 0.81 ug/mL-FEU — ABNORMAL HIGH (ref 0.00–0.50)
Fibrinogen: 448 mg/dL (ref 210–475)
INR: 1.1 (ref 0.8–1.2)
Platelets: 115 10*3/uL — ABNORMAL LOW (ref 150–400)
Prothrombin Time: 14 seconds (ref 11.4–15.2)
Smear Review: NONE SEEN
aPTT: 32 seconds (ref 24–36)

## 2019-10-26 LAB — BASIC METABOLIC PANEL
Anion gap: 10 (ref 5–15)
BUN: 13 mg/dL (ref 6–20)
CO2: 23 mmol/L (ref 22–32)
Calcium: 9.3 mg/dL (ref 8.9–10.3)
Chloride: 110 mmol/L (ref 98–111)
Creatinine, Ser: 0.84 mg/dL (ref 0.61–1.24)
GFR calc Af Amer: >60 mL/min (ref >60)
GFR calc non Af Amer: >60 mL/min (ref >60)
Glucose, Bld: 99 mg/dL (ref 70–99)
Potassium: 4 mmol/L (ref 3.5–5.1)
Sodium: 143 mmol/L (ref 135–145)

## 2019-10-26 LAB — CBC
HCT: 37.3 % — ABNORMAL LOW (ref 39.0–52.0)
Hemoglobin: 11.5 g/dL — ABNORMAL LOW (ref 13.0–17.0)
MCH: 28.8 pg (ref 26.0–34.0)
MCHC: 30.8 g/dL (ref 30.0–36.0)
MCV: 93.5 fL (ref 80.0–100.0)
Platelets: 123 10*3/uL — ABNORMAL LOW (ref 150–400)
RBC: 3.99 MIL/uL — ABNORMAL LOW (ref 4.22–5.81)
RDW: 13.3 % (ref 11.5–15.5)
WBC: 5.8 10*3/uL (ref 4.0–10.5)
nRBC: 0 % (ref 0.0–0.2)

## 2019-10-26 LAB — MAGNESIUM: Magnesium: 2 mg/dL (ref 1.7–2.4)

## 2019-10-26 NOTE — Progress Notes (Signed)
Pulmonary Critical Care Medicine [facility] [place]   PULMONARY CRITICAL CARE SERVICE  PROGRESS NOTE  Date of Service: 10/26/2019  Willie Grimes [facility]  SAY:[id number]  DOB: 06/02/1985   DOA: 10/14/2019  Referring Physician: Carron Curie, MD  HPI: Willie Grimes is a 34 y.o. male seen for follow up of Acute on Chronic Respiratory Failure.  Patient at this time is comfortable without distress was decannulated yesterday and no issues were noted overnight  Medications: Reviewed on Rounds  Physical Exam:  Vitals: Temperature 97.3 pulse 82 respiratory rate is 18 blood pressure is 104/66 saturations 95%  Ventilator Settings decannulated off the ventilator  . General: Comfortable at this time . Eyes: Grossly normal lids, irises & conjunctiva . ENT: grossly tongue is normal . Neck: no obvious mass . Cardiovascular: S1 S2 normal no gallop . Respiratory: No rhonchi no rales are noted at this time . Abdomen: soft . Skin: no rash seen on limited exam . Musculoskeletal: not rigid . Psychiatric:unable to assess . Neurologic: no seizure no involuntary movements         Lab Data:   Basic Metabolic Panel: Recent Labs  Lab 10/22/19 0720 10/26/19 0525  NA 144 143  K 3.8 4.0  CL 109 110  CO2 25 23  GLUCOSE 108* 99  BUN 15 13  CREATININE 0.75 0.84  CALCIUM 9.4 9.3  MG 2.2 2.0  PHOS 4.2  --     ABG: No results for input(s): PHART, PCO2ART, PO2ART, HCO3, O2SAT in the last 168 hours.  Liver Function Tests: No results for input(s): AST, ALT, ALKPHOS, BILITOT, PROT, ALBUMIN in the last 168 hours. No results for input(s): LIPASE, AMYLASE in the last 168 hours. No results for input(s): AMMONIA in the last 168 hours.  CBC: Recent Labs  Lab 10/22/19 0720 10/23/19 0557 10/26/19 0525 10/26/19 1203  WBC 3.8* 4.1 5.8  --   HGB 11.6* 11.2* 11.5*  --   HCT 37.3* 36.1* 37.3*  --   MCV 91.9 92.8 93.5  --   PLT 157 146* 123* 115*    Cardiac Enzymes: No results for  input(s): CKTOTAL, CKMB, CKMBINDEX, TROPONINI in the last 168 hours.  BNP (last 3 results) No results for input(s): BNP in the last 8760 hours.  ProBNP (last 3 results) No results for input(s): PROBNP in the last 8760 hours.  Radiological Exams: No results found.  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 Acute on chronic respiratory failure with hypoxia resolved now decannulated successfully without any changes. 2. Tracheostomy has been removed 3. Diffuse traumatic brain injury slow improvement is noted we will continue to monitor 4. Healthcare associated pneumonia treated and slow resolution we will continue to follow along.   I have personally seen and evaluated the patient, evaluated laboratory and imaging results, formulated the assessment and plan and placed orders. The Patient requires high complexity decision making with multiple systems involvement.  Rounds were done with the Respiratory Therapy Director and Staff therapists and discussed with nursing staff also.  Yevonne Pax, MD [facility] Pulmonary Critical Care Medicine Sleep Medicine

## 2019-10-27 LAB — CBC
HCT: 37.7 % — ABNORMAL LOW (ref 39.0–52.0)
Hemoglobin: 11.9 g/dL — ABNORMAL LOW (ref 13.0–17.0)
MCH: 28.9 pg (ref 26.0–34.0)
MCHC: 31.6 g/dL (ref 30.0–36.0)
MCV: 91.5 fL (ref 80.0–100.0)
Platelets: 121 10*3/uL — ABNORMAL LOW (ref 150–400)
RBC: 4.12 MIL/uL — ABNORMAL LOW (ref 4.22–5.81)
RDW: 13 % (ref 11.5–15.5)
WBC: 4.8 10*3/uL (ref 4.0–10.5)
nRBC: 0 % (ref 0.0–0.2)

## 2019-10-27 LAB — HEPARIN INDUCED PLATELET AB (HIT ANTIBODY): Heparin Induced Plt Ab: 0.187 OD (ref 0.000–0.400)

## 2019-10-28 DIAGNOSIS — Z930 Tracheostomy status: Secondary | ICD-10-CM | POA: Diagnosis not present

## 2019-10-28 DIAGNOSIS — J189 Pneumonia, unspecified organism: Secondary | ICD-10-CM | POA: Diagnosis not present

## 2019-10-28 DIAGNOSIS — S062X9S Diffuse traumatic brain injury with loss of consciousness of unspecified duration, sequela: Secondary | ICD-10-CM | POA: Diagnosis not present

## 2019-10-28 DIAGNOSIS — J9621 Acute and chronic respiratory failure with hypoxia: Secondary | ICD-10-CM | POA: Diagnosis not present

## 2019-10-28 NOTE — Progress Notes (Signed)
Pulmonary Critical Care Medicine [facility] [place]   PULMONARY CRITICAL CARE SERVICE  PROGRESS NOTE  Date of Service: 10/28/2019  [name] [facility]  ONG:[id number]  DOB: 1985-06-19   DOA: 10/14/2019  Referring Physician: Carron Curie, MD  HPI: Willie Grimes is a 34 y.o. male seen for follow up of Acute on Chronic Respiratory Failure.  Patient is on room air doing well.  Continues to show improvement from a neurological perspective  Medications: Reviewed on Rounds  Physical Exam:  Vitals: Temperature 96.8 pulse 62 respiratory rate 16 blood pressure is 118/64 saturations 95%  Ventilator Settings on T collar FiO2 28%  . General: Comfortable at this time . Eyes: Grossly normal lids, irises & conjunctiva . ENT: grossly tongue is normal . Neck: no obvious mass . Cardiovascular: S1 S2 normal no gallop . Respiratory: No rhonchi no rales are noted at this time . Abdomen: soft . Skin: no rash seen on limited exam . Musculoskeletal: not rigid . Psychiatric:unable to assess . Neurologic: no seizure no involuntary movements         Lab Data:   Basic Metabolic Panel: Recent Labs  Lab 10/22/19 0720 10/26/19 0525  NA 144 143  K 3.8 4.0  CL 109 110  CO2 25 23  GLUCOSE 108* 99  BUN 15 13  CREATININE 0.75 0.84  CALCIUM 9.4 9.3  MG 2.2 2.0  PHOS 4.2  --     ABG: No results for input(s): PHART, PCO2ART, PO2ART, HCO3, O2SAT in the last 168 hours.  Liver Function Tests: No results for input(s): AST, ALT, ALKPHOS, BILITOT, PROT, ALBUMIN in the last 168 hours. No results for input(s): LIPASE, AMYLASE in the last 168 hours. No results for input(s): AMMONIA in the last 168 hours.  CBC: Recent Labs  Lab 10/22/19 0720 10/23/19 0557 10/26/19 0525 10/26/19 1203 10/27/19 0553  WBC 3.8* 4.1 5.8  --  4.8  HGB 11.6* 11.2* 11.5*  --  11.9*  HCT 37.3* 36.1* 37.3*  --  37.7*  MCV 91.9 92.8 93.5  --  91.5  PLT 157 146* 123* 115* 121*    Cardiac Enzymes: No results  for input(s): CKTOTAL, CKMB, CKMBINDEX, TROPONINI in the last 168 hours.  BNP (last 3 results) No results for input(s): BNP in the last 8760 hours.  ProBNP (last 3 results) No results for input(s): PROBNP in the last 8760 hours.  Radiological Exams: No results found.  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 Acute on chronic respiratory failure with hypoxia resolved patient is doing fairly well now not requiring any oxygen. 2. Diffuse traumatic brain injury overall no change however is showing some slow improvement so hopefully will continue to get better 3. Healthcare associated pneumonia treated clinically improved no fevers are noted   I have personally seen and evaluated the patient, evaluated laboratory and imaging results, formulated the assessment and plan and placed orders. The Patient requires high complexity decision making with multiple systems involvement.  Rounds were done with the Respiratory Therapy Director and Staff therapists and discussed with nursing staff also.  Yevonne Pax, MD [facility] Pulmonary Critical Care Medicine Sleep Medicine

## 2019-10-30 LAB — CBC
HCT: 39.7 % (ref 39.0–52.0)
Hemoglobin: 12.1 g/dL — ABNORMAL LOW (ref 13.0–17.0)
MCH: 28.5 pg (ref 26.0–34.0)
MCHC: 30.5 g/dL (ref 30.0–36.0)
MCV: 93.6 fL (ref 80.0–100.0)
Platelets: 139 10*3/uL — ABNORMAL LOW (ref 150–400)
RBC: 4.24 MIL/uL (ref 4.22–5.81)
RDW: 13.5 % (ref 11.5–15.5)
WBC: 9 10*3/uL (ref 4.0–10.5)
nRBC: 0 % (ref 0.0–0.2)

## 2019-10-30 LAB — BASIC METABOLIC PANEL
Anion gap: 12 (ref 5–15)
BUN: 24 mg/dL — ABNORMAL HIGH (ref 6–20)
CO2: 26 mmol/L (ref 22–32)
Calcium: 9.8 mg/dL (ref 8.9–10.3)
Chloride: 107 mmol/L (ref 98–111)
Creatinine, Ser: 0.81 mg/dL (ref 0.61–1.24)
GFR calc Af Amer: >60 mL/min (ref >60)
GFR calc non Af Amer: >60 mL/min (ref >60)
Glucose, Bld: 287 mg/dL — ABNORMAL HIGH (ref 70–99)
Potassium: 4.5 mmol/L (ref 3.5–5.1)
Sodium: 145 mmol/L (ref 135–145)

## 2019-10-30 LAB — MAGNESIUM: Magnesium: 1.8 mg/dL (ref 1.7–2.4)

## 2019-10-30 LAB — PHOSPHORUS: Phosphorus: 4.2 mg/dL (ref 2.5–4.6)

## 2019-10-30 LAB — SARS CORONAVIRUS 2 (TAT 6-24 HRS): SARS Coronavirus 2: NEGATIVE

## 2021-04-12 IMAGING — XA IR PERC PLACEMENT GASTROSTOMY
5 series · 6 of 6 positions shown · non-contrast
Comparison: None.

INDICATION: History traumatic brain injury now with dysphagia. Request made for
placement a gastrostomy tube for enteric nutrition supplementation
purposes.

EXAM:
PULL TROUGH GASTROSTOMY TUBE PLACEMENT

[Series 1: fl (-) angio · 1 of 1 slices shown (1 of 5)]
[im 1/1]
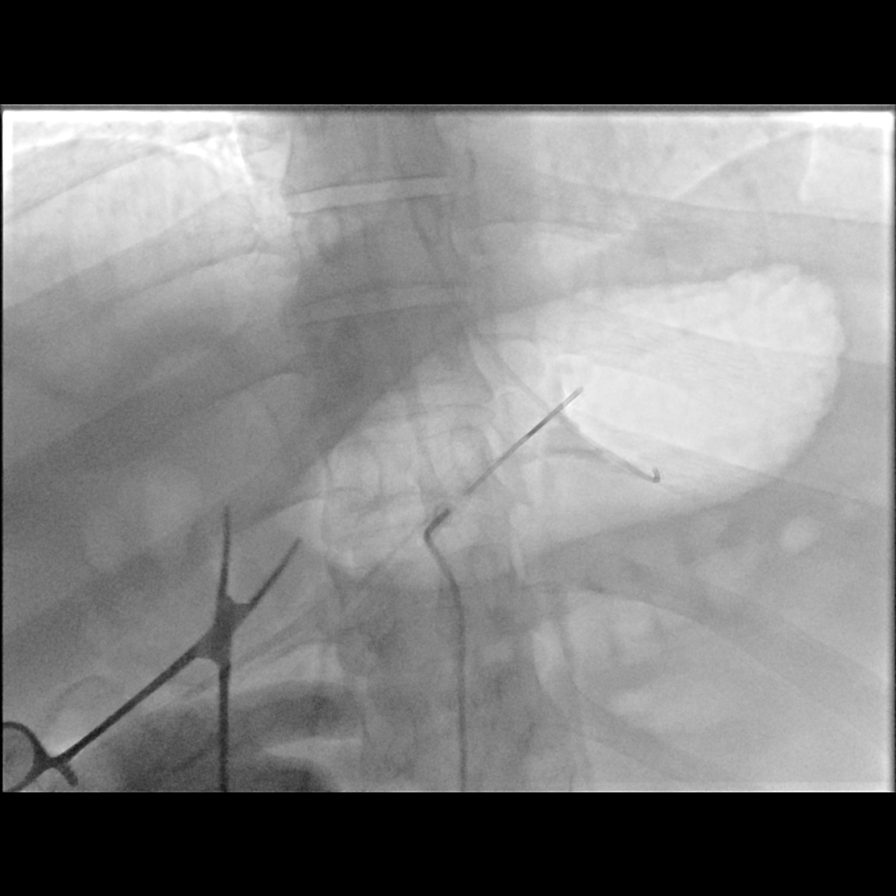

[Series 2: fl (-) angio · 1 of 1 slices shown (2 of 5)]
[im 1/1]
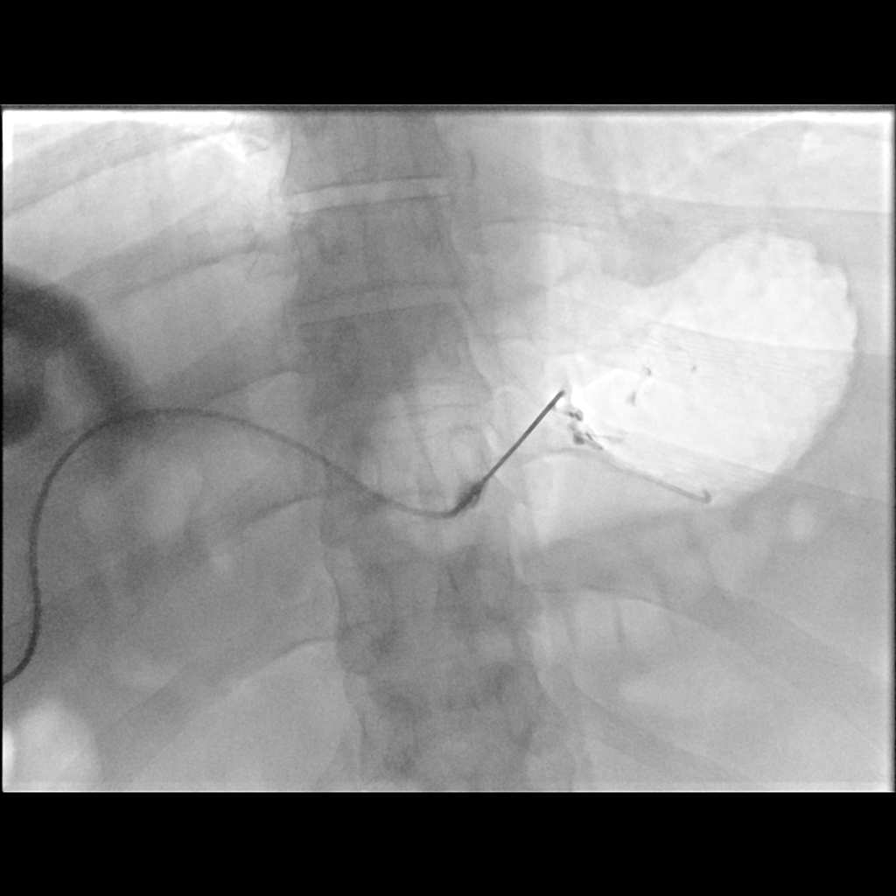

[Series 3: fl (-) angio · 1 of 1 slices shown (3 of 5)]
[im 1/1]
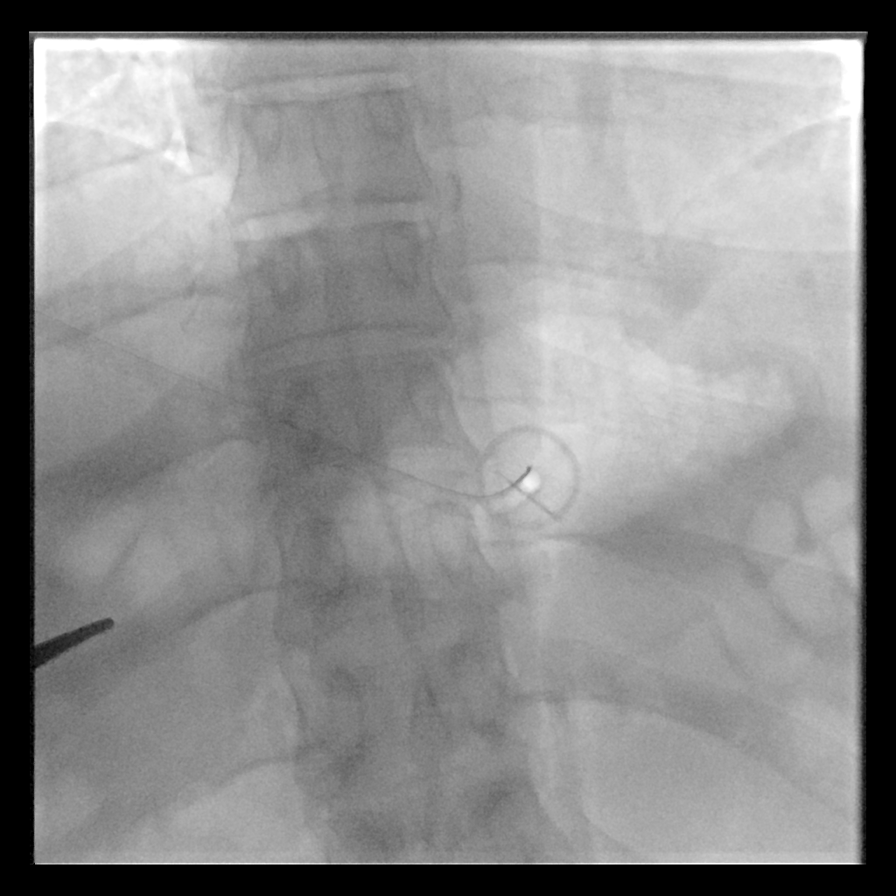

[Series 4: fl (-) angio · 1 of 1 slices shown (4 of 5)]
[im 1/1]
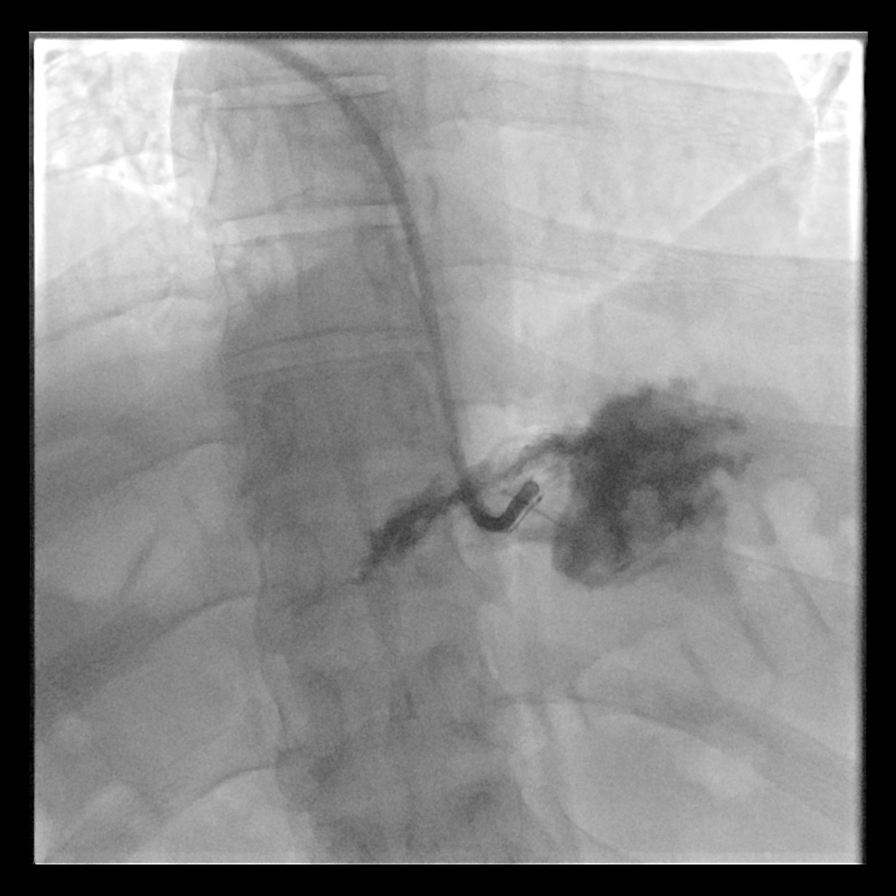

[Series 5: fl (-) angio · 2 of 2 slices shown (5 of 5)]
[im 1/2]
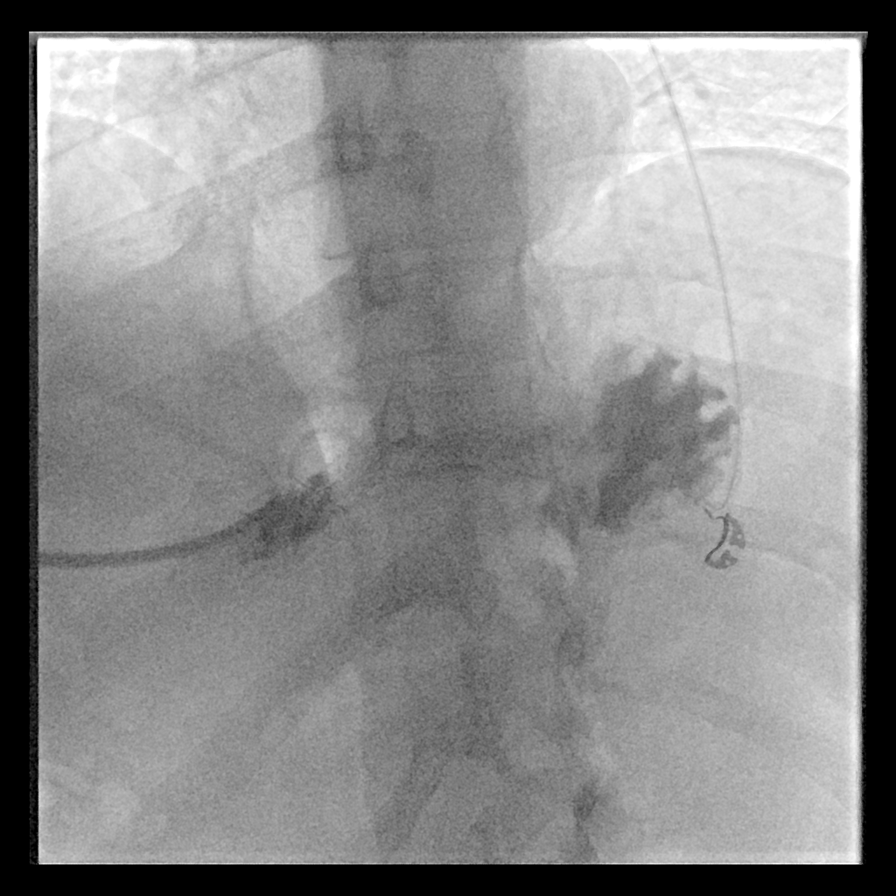
[im 2/2]
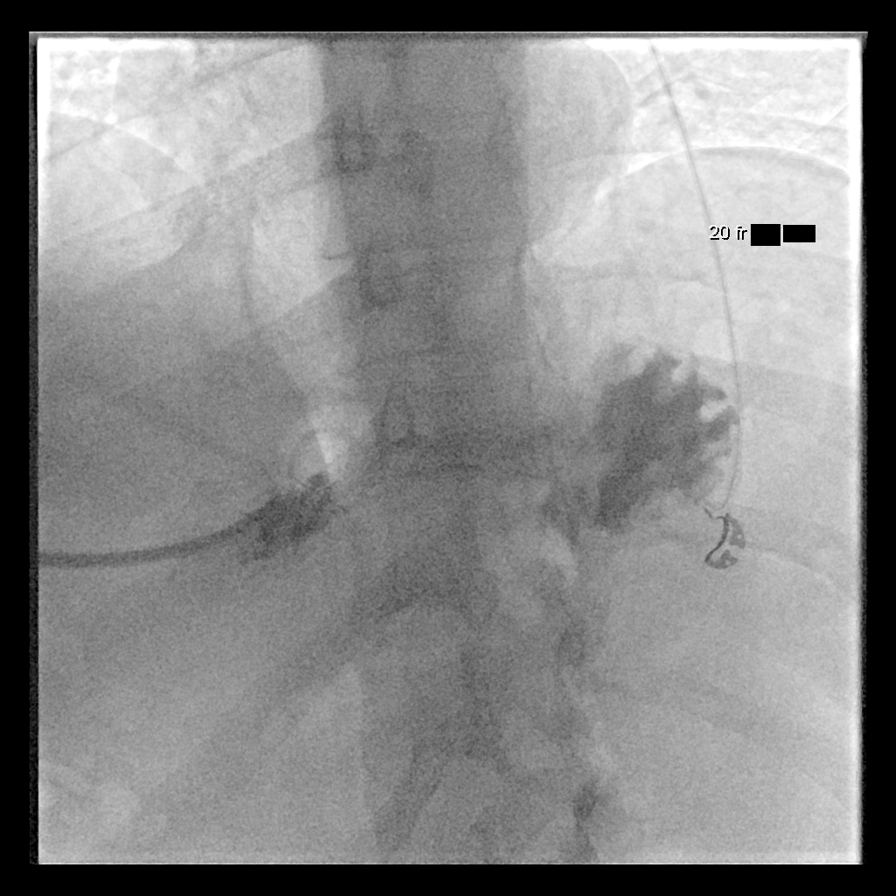

[6 of 6 positions shown; findings below may reference images not displayed]

MEDICATIONS:
Ancef 2 gm IV; Antibiotics were administered within 1 hour of the
procedure.

Glucagon 1 mg IV

CONTRAST:  20 mL of Omnipaque 300 administered into the gastric
lumen.

ANESTHESIA/SEDATION:
Moderate (conscious) sedation was employed during this procedure. A
total of Versed 1 mg and Fentanyl 50 mcg was administered
intravenously.

Moderate Sedation Time: 14 minutes. The patient's level of
consciousness and vital signs were monitored continuously by
radiology nursing throughout the procedure under my direct
supervision.

FLUOROSCOPY TIME:  4 minutes, 30 seconds (172 mGy)

COMPLICATIONS:
None immediate.

PROCEDURE:
Informed written consent was obtained from patient's family
following explanation of the procedure, risks, benefits and
alternatives. A time out was performed prior to the initiation of
the procedure. Ultrasound scanning was performed to demarcate the
edge of the left lobe of the liver. Maximal barrier sterile
technique utilized including caps, mask, sterile gowns, sterile
gloves, large sterile drape, hand hygiene and Betadine prep.

The left upper quadrant was sterilely prepped and draped. An oral
gastric catheter was inserted into the stomach under fluoroscopy.
The existing nasogastric feeding tube was removed. The left costal
margin and air opacified transverse colon were identified and
avoided. Air was injected into the stomach for insufflation and
visualization under fluoroscopy. Under sterile conditions a 17 gauge
trocar needle was utilized to access the stomach percutaneously
beneath the left subcostal margin after the overlying soft tissues
were anesthetized with 1% Lidocaine with epinephrine. Needle
position was confirmed within the stomach with aspiration of air and
injection of small amount of contrast. A single T tack was deployed
for gastropexy. Over an Amplatz guide wire, a 9-French sheath was
inserted into the stomach. A snare device was utilized to capture
the oral gastric catheter. The snare device was pulled retrograde
from the stomach up the esophagus and out the oropharynx. The
20-French pull-through gastrostomy was connected to the snare device
and pulled antegrade through the oropharynx down the esophagus into
the stomach and then through the percutaneous tract external to the
patient. The gastrostomy was assembled externally. Contrast
injection confirms position in the stomach. Several spot
radiographic images were obtained in various obliquities for
documentation. The patient tolerated procedure well without
immediate post procedural complication.
FINDINGS: After successful fluoroscopic guided placement, the gastrostomy tube
is appropriately positioned with internal disc against the ventral
aspect of the gastric lumen.
IMPRESSION: Successful fluoroscopic insertion of a 20-French pull-through
gastrostomy tube.

The gastrostomy may be used immediately for medication
administration and in 24 hrs for the initiation of feeds.
